# Patient Record
Sex: Female | Born: 1950
Health system: Southern US, Community
[De-identification: ages and names within clinical notes are randomized; demographics above are authoritative.]

## PROBLEM LIST (undated history)

## (undated) DIAGNOSIS — I809 Phlebitis and thrombophlebitis of unspecified site: Secondary | ICD-10-CM

## (undated) DIAGNOSIS — T7840XA Allergy, unspecified, initial encounter: Secondary | ICD-10-CM

## (undated) DIAGNOSIS — I10 Essential (primary) hypertension: Secondary | ICD-10-CM

## (undated) DIAGNOSIS — M069 Rheumatoid arthritis, unspecified: Secondary | ICD-10-CM

## (undated) HISTORY — DX: Allergy, unspecified, initial encounter: T78.40XA

## (undated) HISTORY — PX: LASER ABLATION: SHX1947

## (undated) HISTORY — DX: Essential (primary) hypertension: I10

## (undated) HISTORY — DX: Phlebitis and thrombophlebitis of unspecified site: I80.9

---

## 1992-06-25 HISTORY — PX: KNEE ARTHROSCOPY: SHX127

## 1993-06-25 HISTORY — PX: ABDOMINAL HYSTERECTOMY: SHX81

## 1998-05-09 ENCOUNTER — Encounter (HOSPITAL_COMMUNITY): Admission: RE | Admit: 1998-05-09 | Discharge: 1998-08-07 | Payer: Self-pay | Admitting: Rheumatology

## 1998-10-12 ENCOUNTER — Encounter (HOSPITAL_COMMUNITY): Admission: RE | Admit: 1998-10-12 | Discharge: 1999-01-10 | Payer: Self-pay | Admitting: Rheumatology

## 2001-06-25 HISTORY — PX: SHOULDER ARTHROSCOPY: SHX128

## 2001-06-25 HISTORY — PX: ROTATOR CUFF REPAIR: SHX139

## 2001-11-13 ENCOUNTER — Ambulatory Visit (HOSPITAL_BASED_OUTPATIENT_CLINIC_OR_DEPARTMENT_OTHER): Admission: RE | Admit: 2001-11-13 | Discharge: 2001-11-13 | Payer: Self-pay | Admitting: Orthopedic Surgery

## 2003-06-26 HISTORY — PX: SHOULDER ARTHROSCOPY W/ ROTATOR CUFF REPAIR: SHX2400

## 2003-08-09 ENCOUNTER — Encounter: Admission: RE | Admit: 2003-08-09 | Discharge: 2003-08-09 | Payer: Self-pay | Admitting: Rheumatology

## 2004-02-21 IMAGING — CR DG LUMBAR SPINE COMPLETE 4+V
5 series · 5 of 5 positions shown · non-contrast
Comparison: none

CLINICAL DATA: Back pain and right hip pain.  History of rheumatoid arthritis.
COMPLETE RIGHT HIP ? 2 VIEWS ? 08/09/03 
There is no evidence of fracture or dislocation. No other significant bone or soft tissue abnormalities are identified. The joint spaces are within normal limits.

[view not recorded (1 of 5)]
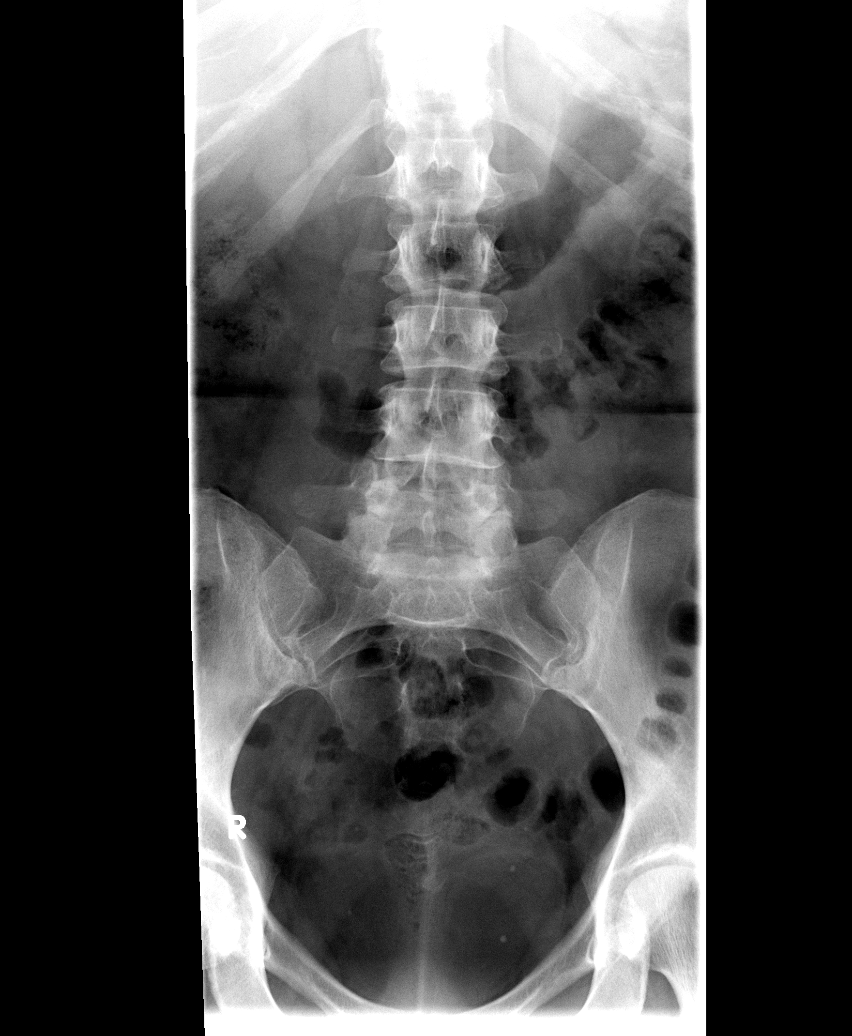

[view not recorded (2 of 5)]
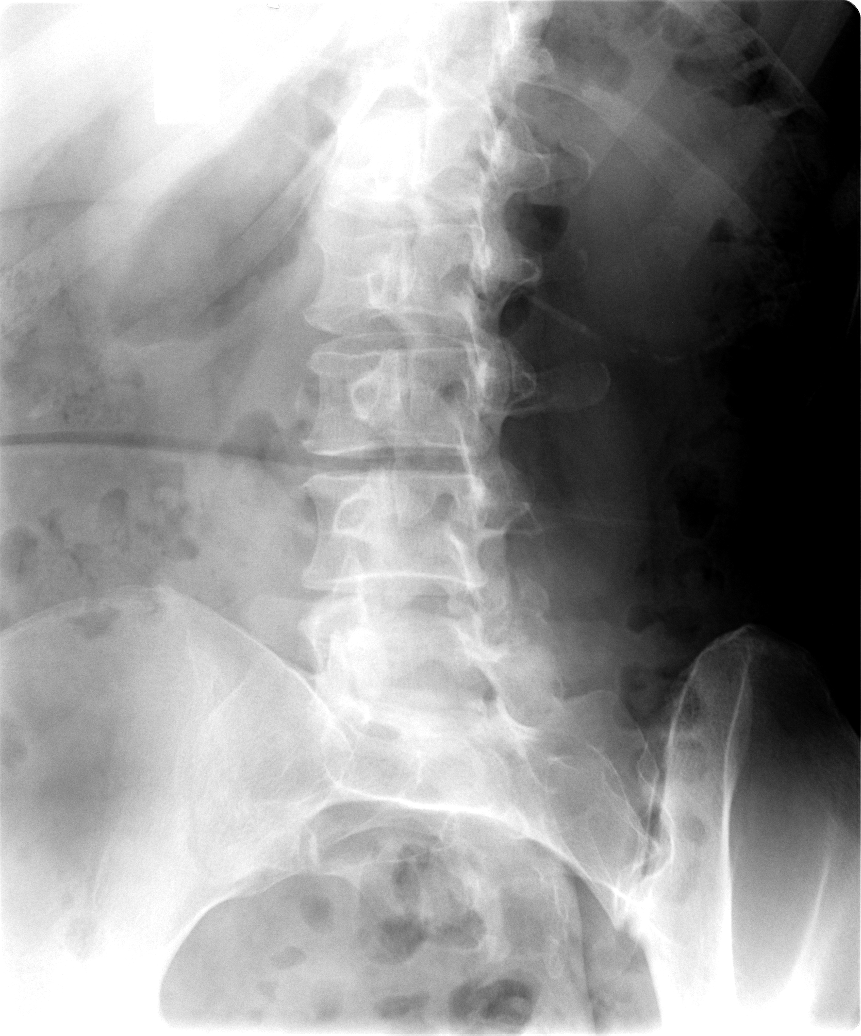

[view not recorded (3 of 5)]
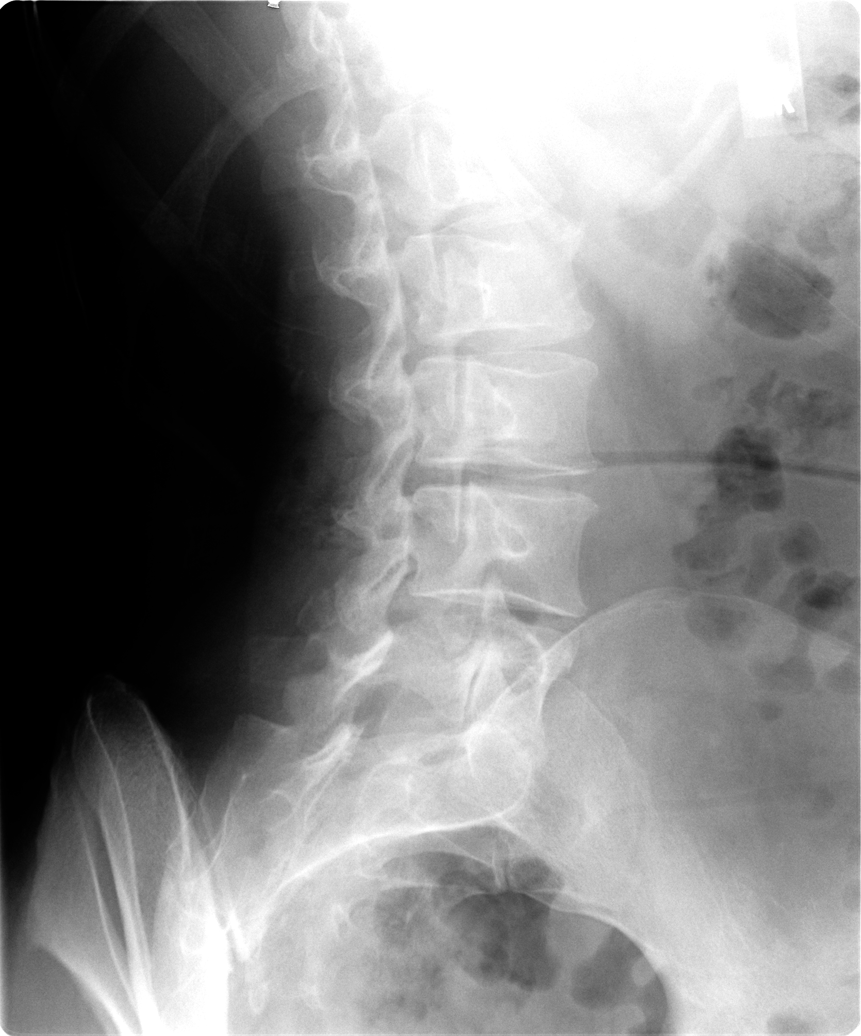

[view not recorded (4 of 5)]
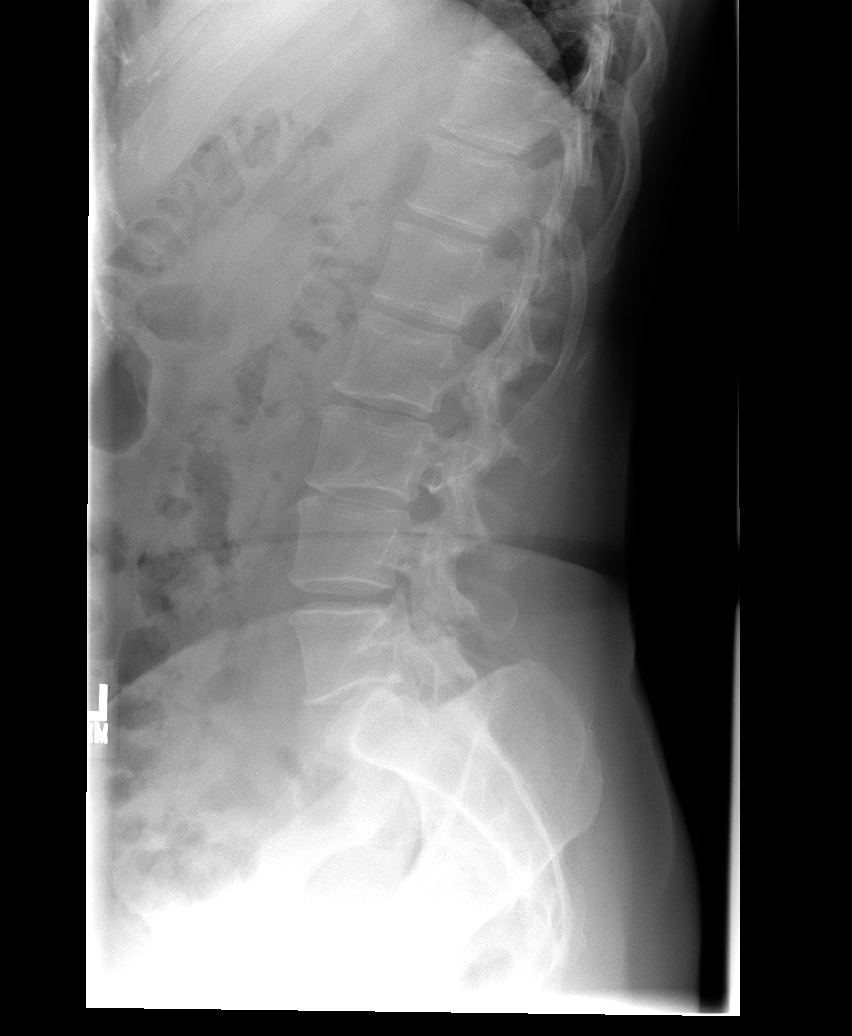

[view not recorded (5 of 5)]
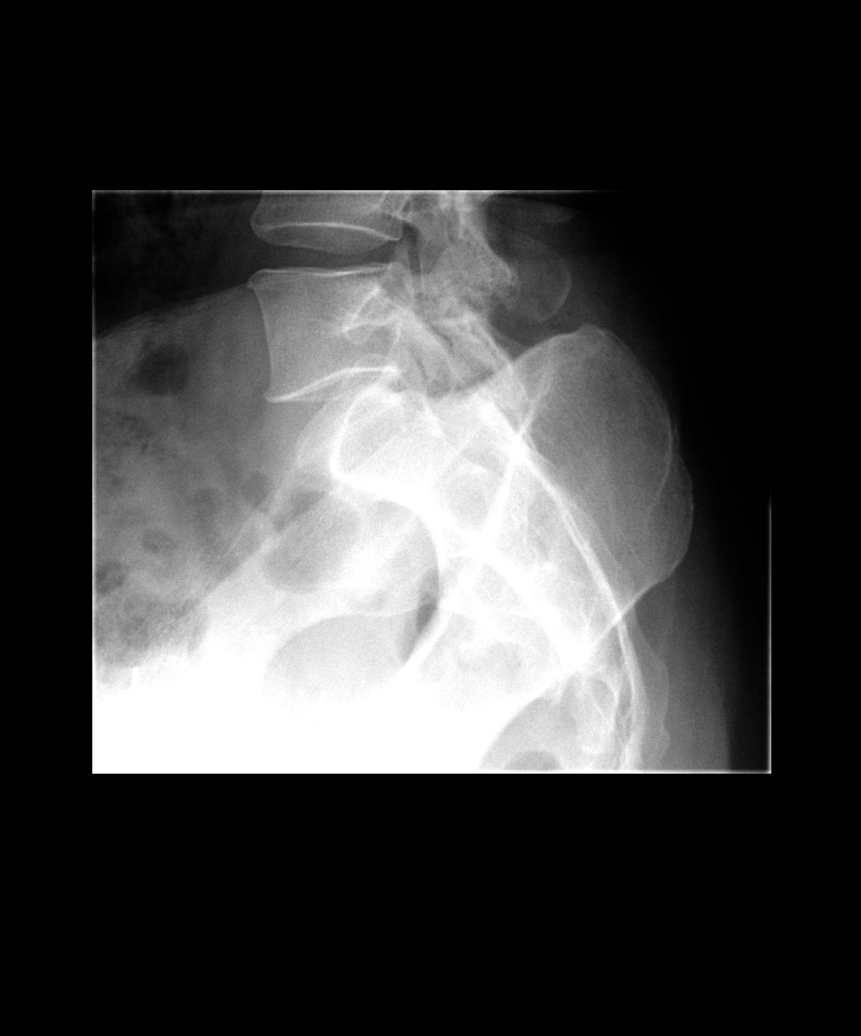

[5 of 5 positions shown; findings below may reference images not displayed]

IMPRESSION
Normal study. 
COMPLETE LUMBAR SPINE ? 08/09/03 
Mild to moderate degenerative disk disease and spondylosis throughout the lumbar spine are noted.  Normal lumbar alignment is noted. Mild facet arthropathy in the lower lumbar spine.
IMPRESSION
Mild to moderate degenerative disk disease and spondylosis through the lumbar spine.

## 2004-03-31 ENCOUNTER — Ambulatory Visit (HOSPITAL_COMMUNITY): Admission: RE | Admit: 2004-03-31 | Discharge: 2004-03-31 | Payer: Self-pay | Admitting: Orthopedic Surgery

## 2004-05-03 ENCOUNTER — Ambulatory Visit (HOSPITAL_BASED_OUTPATIENT_CLINIC_OR_DEPARTMENT_OTHER): Admission: RE | Admit: 2004-05-03 | Discharge: 2004-05-03 | Payer: Self-pay | Admitting: Orthopedic Surgery

## 2004-05-03 ENCOUNTER — Ambulatory Visit (HOSPITAL_COMMUNITY): Admission: RE | Admit: 2004-05-03 | Discharge: 2004-05-03 | Payer: Self-pay | Admitting: Orthopedic Surgery

## 2007-12-31 ENCOUNTER — Ambulatory Visit: Payer: Self-pay | Admitting: Vascular Surgery

## 2008-02-02 ENCOUNTER — Ambulatory Visit: Payer: Self-pay | Admitting: Vascular Surgery

## 2008-02-06 ENCOUNTER — Ambulatory Visit: Payer: Self-pay | Admitting: Vascular Surgery

## 2008-03-03 ENCOUNTER — Ambulatory Visit: Payer: Self-pay | Admitting: Vascular Surgery

## 2008-03-10 ENCOUNTER — Ambulatory Visit: Payer: Self-pay | Admitting: Vascular Surgery

## 2008-04-14 ENCOUNTER — Ambulatory Visit: Payer: Self-pay | Admitting: Vascular Surgery

## 2009-02-02 ENCOUNTER — Encounter: Admission: RE | Admit: 2009-02-02 | Discharge: 2009-02-02 | Payer: Self-pay | Admitting: Rheumatology

## 2010-11-07 NOTE — Assessment & Plan Note (Signed)
OFFICE VISIT   Samantha [name], Samantha [name]  DOB:  1951-05-28                                       02/02/2008  AVWUJ#:[id number]   The patient is a patient who has been evaluated by Dr. Arbie Cookey for severe  venous insufficiency in the right leg with painful varicosities and  distal edema.  She was wearing elastic compression stockings and trying  her analgesics (such as Aleve and Tylenol) on a regular basis, as well  as elevation of the legs with no improvement of her symptoms.  Two days  ago, she developed a spontaneous bleed from a varix in the subcutaneous  position with increasing discomfort, and on exam today, she has a large  ecchymotic area surrounding these bulging varicosities in the right  medial calf.  She has no evidence of acute thrombophlebitis, but she  clearly has bleeding from varix beneath the skin.  She has also not had  any improvement in her symptoms after a month of wearing elastic  compression stockings.  I think it would be wise for Dr. Arbie Cookey to  proceed with laser ablation to the right greater saphenous vein with  multiple stab phlebectomies in the near future, and we will proceed with  precertification.   Quita Skye Hart Rochester, M.D.  Electronically Signed   JDL/MEDQ  D:  02/02/2008  T:  02/03/2008  Job:  [id number]

## 2010-11-07 NOTE — Assessment & Plan Note (Signed)
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  She has resolved all of  the bruising from the time of her procedure and is quite pleased with  her result.  She had an area over her posterior thigh that made it very  uncomfortable for her to sit, and this has resolved.  Her wounds are all  healing quite nicely.  She is pleased with her result, as am I, and I  plan to see her again on an as needed basis.   Larina Earthly, M.D.  Electronically Signed   TFE/MEDQ  D:  04/14/2008  T:  04/15/2008  Job:  [id number]

## 2010-11-07 NOTE — Consult Note (Signed)
NEW PATIENT CONSULTATION   [name], Samantha K  DOB:  03/08/1951                                       12/31/2007  EAVWU#:[id number]   The patient presents today for evaluation of pain and swelling with  right leg venous varicosities.  She has had a long history of venous  varicosities and recently has began having more severe pain related to  varicosities over the posterior aspect of her right thigh.  She does  have a remote history of superficial thrombophlebitis in the area around  her knee.  She does not have any history of deep venous thrombosis.  She  reports that this is most pronounced when she is sitting and can happen  with standing as well.  She does report some swelling in her right foot  and ankle at the end of the day as well.  She does take Aleve and  Tylenol for this discomfort, has not worn graduated compression  stockings.  She has tried weight loss which has not been effective as  well.  She does have a history of varicose veins in her mother.   PAST MEDICAL HISTORY:  Is significant for hysterectomy in 1994, knee  arthroscopy in 1995, rotator cuff repair 2003.  Does have a history of  rheumatoid arthritis and is on methotrexate for this.  She is also on  Humira, multivitamins and folic acid.  She does not smoke cigarettes.   PHYSICAL EXAM:  General:  Well-developed, well-nourished white female  appearing stated age of 73.  Vital signs:  Blood pressure is 120/78,  heart rate 76 and respirations 16.  Extremities:  Her left leg is  without varicosities.  Right leg, she does have pronounced saphenous  tributary varicosities over her posterior distal thigh and also over her  medial calf.   On screening duplex she does have gross reflux in her right great  saphenous vein feeding into these varicosities.  I discussed the  significance of her venous hypertension and venous reflux with the  patient.  I recommended we initially begin treatment with  graduated  compression stockings.  She is fitted today with 20-30 mm stockings and  is instructed on their use.  I will see her again in 3 months for a  continued discussion to determine if this conservative treatment is  aiding her symptoms.  I did discuss the potential possibility for laser  ablation and stab phlebectomy if she fails conservative treatment.  We  will see her again in 3 months for continued discussion.   Larina Earthly, M.D.  Electronically Signed   TFE/MEDQ  D:  12/31/2007  T:  01/01/2008  Job:  [id number]

## 2010-11-07 NOTE — Procedures (Signed)
LOWER EXTREMITY VENOUS REFLUX EXAM   INDICATION:  Chronic right leg varicose vein with recent bleed at the  proximal right calf.   EXAM:  Using color-flow imaging and pulse Doppler spectral analysis, the  right common femoral, superficial femoral, popliteal, posterior tibial,  greater and lesser saphenous veins are evaluated.  There is evidence  suggesting deep venous insufficiency in the right common femoral vein  only.   The right saphenofemoral junction is not competent.  The right GSV is  not competent with the caliber as described below.   The right proximal short saphenous vein demonstrates competency.   GSV Diameter (used if found to be incompetent only)                                            Right    Left  Proximal Greater Saphenous Vein           0.82 cm  cm  Proximal-to-mid-thigh                     0.60 cm  cm  Mid thigh                                 0.68 cm  cm  Mid-distal thigh                          0.54 cm  cm  Distal thigh                              0.32 cm  cm  Knee                                      0.60 cm  cm    IMPRESSION:  1. Right greater saphenous vein reflux is identified with the caliber      ranging from 0.32 cm to 0.82 cm knee to groin.  2. The right greater saphenous vein is not aneurysmal.  3. The right greater saphenous vein is not tortuous.  4. The deep venous system is competent except for the right common      femoral vein.  5. The right lesser saphenous vein is competent.        ___________________________________________  Larina Earthly, M.D.   MC/MEDQ  D:  02/06/2008  T:  02/06/2008  Job:  [id number]

## 2010-11-07 NOTE — Procedures (Signed)
DUPLEX DEEP VENOUS EXAM - LOWER EXTREMITY   INDICATION:  Follow-up evaluation, status post laser ablation.   HISTORY:  Edema:  No.  Trauma/Surgery:  On 03/03/08, patient had right greater saphenous vein  laser ablation.  Pain:  Tenderness at the mid thigh.  PE:  No.  Previous DVT:  Superficial venous thrombus in 1982 in the right leg,  post partum.  Anticoagulants:  No.  Other:   DUPLEX EXAM:                CFV   SFV   PopV  PTV    GSV                R  L  R  L  R  L  R   L  R  L  Thrombosis    o     o     o     o      +  Spontaneous   +     +     +     +      0  Phasic        +     +     +     +      0  Augmentation  +     +     +     +      0  Compressible  +     +     +     +      0  Competent     +     +     +     +      0   Legend:  + - yes  o - no  p - partial  D - decreased   IMPRESSION:  1. The right greater saphenous vein is thrombosed up to the      saphenofemoral junction.  There is no thrombus present in the      common femoral vein.  2. The right common femoral vein is competent.  3. No evidence of deep venous thrombosis or baker's cyst in the right      leg.   _____________________________  Larina Earthly, M.D.   MC/MEDQ  D:  03/10/2008  T:  03/10/2008  Job:  [id number]

## 2010-11-07 NOTE — Assessment & Plan Note (Signed)
OFFICE VISIT   [name], Samantha K  DOB:  23-Jun-1951                                       02/06/2008  [id number]#:[id number]   Here today for continued followup of her right leg venous pathology.  She saw Dr. Hart [place] on 08/10 where she had a significant subcutaneous  bleed from the varicosities in her medial proximal calf.  She underwent  formal duplex today and this reveals reflux throughout her saphenous  vein on the right.  She has no evidence of DVT and has mild reflux in  the common femoral vein associated with her saphenous reflux.  She does  have the area of her posterior distal thigh where there is a reticular  vein that is a tributary off of her saphenous vein.  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  This is in communication with her saphenous vein.  I suspect that she will have improvement with ablation of her saphenous  vein.  She is to continue her compression garments in the interim.  I  feel with her recent subcutaneous bleed despite compression garments  that we should proceed with laser ablation of the right greater  saphenous vein, stab phlebectomy of the tributaries on the right leg, as  well as sclerotherapy for symptom relief and to prevent ongoing  complications. We will proceed with precertification to assure insurance  coverage.   Larina Earthly, M.D.  Electronically Signed   TFE/MEDQ  D:  02/06/2008  T:  02/09/2008  Job:  [id number]

## 2010-11-07 NOTE — Assessment & Plan Note (Signed)
OFFICE VISIT   [name], Samantha K  DOB:  1950/07/04                                       03/10/2008  [id number]#:[id number]   The patient presents today for 1-week followup of her right greater  saphenous vein laser ablation and stab phlebectomy of multiple tributary  varicosities in her thigh, calf, and ankle.  She has done quite well  since her procedure.  She had the usual amount of discomfort and  bruising.  Her phlebectomy sites look actually quite good, especially  over her ankle.  She has marked varicosities which are completely  resolved.  She underwent duplex today, and this reveals occluded great  saphenous vein on the right from her knee to the saphenofemoral  junction.  She has no injury to the deep venous system on the right.  I  am quite pleased with her initial result, as is the patient.  I plan to  see her again in 6 weeks for followup.   Larina Earthly, M.D.  Electronically Signed   TFE/MEDQ  D:  03/10/2008  T:  03/11/2008  Job:  [id number]

## 2010-11-10 NOTE — Op Note (Signed)
[facility]  Patient:    Samantha [name], Samantha [name] Visit Number: [id number] MRN: [id number]          Service Type: DSU Location: [facility] Attending Physician:  Colbert Ewing Dictated by:   Loreta Ave, M.D. Proc. 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  No full thickness tears.  Anterior labral tear.  PROCEDURE:  Left shoulder exam under anesthesia, arthroscopy, debridement of labrum of the rotator cuff, arthroscopy with acromioplasty, release of CA ligament, excision of distal clavicle.  SURGEON:  Loreta Ave, M.D.  ASSISTANT:  Arlys John D. Petrarca, P.A.-C.  ANESTHESIA:  General.  ESTIMATED BLOOD LOSS:  Minimal.  SPECIMENS:  None.  CULTURES:  None.  COMPLICATIONS:  None.  DRESSING:  Self-compressive with sling.  DESCRIPTION OF PROCEDURE:  The patient was brought to the operating room and placed on the operating table in the supine position.  After adequate anesthesia had been obtained, the left shoulder was examined.  Full motion and good stability.  Placed in a beach chair position on the shoulder positioner. Prepped and draped in the usual sterile fashion.  Three standard arthroscopic portals - anterior, posterior, and lateral.  Shoulder entered with a blunt obturator, distended and inspected.  Articular cartilage intact throughout. Some mild fraying of the anterior labrum at the 2 oclock position.  Debrided with the shaver to a stable surface.  Biceps tendon and biceps anchor intact. Rotator cuff from below looked good as did the biceps at the hiatus. Remaining  structures in the shoulder were excellent in regards to articular cartilage and capsular ligamentous structures.  Cannula redirected subacromially.  Reactive bursitis.  Abrasive tearing in the superior cuff. Type 2 acromion.  Bursa resected and cuff debrided.  Acromioplasty to a type 1 acromion utilizing shaver and high speed bur.  CA ligament incised with cautery.  Distal clavicle already had a disruptive inferior capsule.  Grade 3 and 4 changes.  Lateral centimeter of the clavicle and periarticular spur is resected, resecting a centimeter of the clavicle.  Adequacy of decompression and clavicle excision confirmed viewing from all portals.  Instruments and fluid removed.  Portals closed and the bursa injected with Marcaine.  Portals closed with 4-0 nylon.  A sterile compressive dressing applied with sling.  Anesthesia reversed.  Brought to the recovery room.  Tolerated surgery well with no complications. Dictated by:   Loreta Ave, M.D. Attending Physician:  Colbert Ewing DD:  11/13/01 TD:  11/15/01 Job: [id number]

## 2010-11-10 NOTE — Op Note (Signed)
NAMEELLANA, KAWA              ACCOUNT NO.:  [id number]   MEDICAL RECORD NO.:  [id number]          PATIENT TYPE:  AMB   LOCATION:  DSC                          FACILITY:  [facility]   PHYSICIAN:  Loreta Ave, M.D. 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   POSTOPERATIVE DIAGNOSES:  Right shoulder subacromial impingement with distal  clavicle osteolysis.  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  Focal grade 3 and 4 changes, humeral head as well as complex  circumferential labral tears.   OPERATION PERFORMED:  Right shoulder examination under anesthesia,  arthroscopy, debridement glenohumeral joint including chondroplasty of the  humeral head.  Debridement of labral tear.  Debridement of rotator cuff  above and below.  Bursectomy.  Acromioplasty.  Release coracoacromial  ligament.  Excision of distal clavicle.   SURGEON:  Loreta Ave, M.D.   ASSISTANT:  Genene Churn. Denton Meek.   ANESTHESIA:  General.   ESTIMATED BLOOD LOSS:  Minimal.   SPECIMENS:  None.   CULTURES:  None.   COMPLICATIONS:  None.   DRESSING:  Soft compressive with sling.   DESCRIPTION OF PROCEDURE:  The patient was brought to the operating room and  after adequate anesthesia had been obtained, placed in a beach chair  position and prepped and draped in the usual sterile fashion.  Full motion,  stable shoulder.  Three standard arthroscopic portals, anterior, posterior  lateral.  Shoulder entered with blunt obturator, distended and inspected.  Most of the glenohumeral joint looked good  except for a focal little bit  more than 1 cm diameter area of full thickness loss from rheumatoid  arthritis.  Chondroplasty throughout the area.  Down to bone into little  subchondral cyst, all debrided.  The remaining articular cartilage looked  quite good and there was not a lot of inflammatory arthropathy in the  shoulder.  Complex tearing of the labrum circumferential mostly at the top  half debrided.  Biceps tendon, biceps anchor intact.  Undersurface of the  cuff looked good.  Cannula redirected subacromially.  Typical reactive  bursitis and impingement.  Cuff debrided.  No full thickness tears.  Bursa  resected.  Acromioplasty from a type 2 to a type 1 acromion releasing the  coracoacromial ligament.  Distal clavicle grade 4 changes, marked bony  destruction and osteolysis.  Periarticular spurs.  Periarticular spurs  removed.  Joint debrided.  Lateral centimeter of clavicle resected.  Adequacy of decompression and clavicle excision confirmed viewing from all  portals.  Instruments and fluid removed.  Portals of shoulder and bursa  injected with Marcaine.  Portals closed with 4-0 nylon.  Sterile compressive  dressing with sling applied.  Anesthesia reversed.  Brought to recovery  room.  Tolerated surgery well without complication.  Valentino Saxon   DFM/MEDQ  D:  05/03/2004  T:  05/03/2004  Job:  [id number]

## 2011-11-12 ENCOUNTER — Encounter: Payer: Self-pay | Admitting: Emergency Medicine

## 2011-11-12 ENCOUNTER — Emergency Department
Admission: EM | Admit: 2011-11-12 | Discharge: 2011-11-12 | Disposition: A | Discharge status: Home / Self Care | Payer: 59 | Source: Home / Self Care | Attending: Family Medicine | Admitting: Family Medicine

## 2011-11-12 DIAGNOSIS — H9390 Unspecified disorder of ear, unspecified ear: Secondary | ICD-10-CM

## 2011-11-12 DIAGNOSIS — J069 Acute upper respiratory infection, unspecified: Secondary | ICD-10-CM

## 2011-11-12 HISTORY — DX: Rheumatoid arthritis, unspecified: M06.9

## 2011-11-12 MED ORDER — CEFDINIR 300 MG PO CAPS: 300.0000 mg | ORAL_CAPSULE | Freq: Two times a day (BID) | ORAL | Status: AC

## 2011-11-12 NOTE — ED Provider Notes (Signed)
History     CSN: [id number]  Arrival date & time 11/12/11  1724   First MD Initiated Contact with Patient 11/12/11 1759      Chief Complaint  Patient presents with  . URI      HPI Comments: Patient complains of approximately 7 day history of gradually progressive URI symptoms beginning with a mild sore throat (now improved), followed by progressive nasal congestion.  She has had a minimal cough for about 4 days.  Yesterday she developed increased left facial pressure.  Her ears have felt somewhat clogged for about 4 days, worse on the left. Complains of fatigue but no myalgias. There has been no pleuritic pain, shortness of breath, or wheezes.   The history is provided by the patient.    Past Medical History  Diagnosis Date  . RA (rheumatoid arthritis)     Past Surgical History  Procedure Date  . Abdominal hysterectomy     History reviewed. No pertinent family history.  History  Substance Use Topics  . Smoking status: Never Smoker   . Smokeless tobacco: Not on file  . Alcohol Use: No    OB History    Grav Para Term Preterm Abortions TAB SAB Ect Mult Living                  Review of Systems + sore throat + cough No pleuritic pain No wheezing + nasal congestion + post-nasal drainage + sinus pain/pressure No itchy/red eyes ? earache No hemoptysis No SOB No fever/chills No nausea No vomiting No abdominal pain No diarrhea No urinary symptoms No skin rashes + fatigue No myalgias No headache Used OTC meds without relief (Mucinex) Allergies  Tetracyclines & related  Home Medications   Current Outpatient Rx  Name Route Sig Dispense Refill  . DM-GUAIFENESIN ER 30-600 MG PO TB12 Oral Take 1 tablet by mouth every 12 (twelve) hours.    Marland Kitchen FOLIC ACID 1 MG PO TABS Oral Take 1 mg by mouth daily.    . IBUPROFEN 200 MG PO TABS Oral Take 200 mg by mouth every 6 (six) hours as needed.    . METHOTREXATE SODIUM 15 MG PO TABS Oral Take 15 mg by mouth once a week.  Caution: Chemotherapy. Protect from light.    Marland Kitchen NAPROXEN SODIUM 220 MG PO TABS Oral Take 220 mg by mouth 2 (two) times daily with a meal.    . CEFDINIR 300 MG PO CAPS Oral Take 1 capsule (300 mg total) by mouth 2 (two) times daily. 14 capsule 0    BP 142/92  Pulse 71  Temp(Src) 98.7 F (37.1 C) (Oral)  Resp 16  Ht 5\' 3"  (1.6 m)  Wt 177 lb (80.287 kg)  BMI 31.35 kg/m2  SpO2 98%  Physical Exam Nursing notes and Vital Signs reviewed. Appearance:  Patient appears stated age, and in no acute distress.  Patient is obese (BMI 31.4) Eyes:  Pupils are equal, round, and reactive to light and accomodation.  Extraocular movement is intact.  Conjunctivae are not inflamed  Ears:  Canals normal.   Right tympanic membrane normal;  Left tympanic membrane suspicious for clear effusion but not definite  Nose:  Mildly congested turbinates.  No sinus tenderness.     Pharynx:  Normal Neck:  Supple.  Slightly tender shotty posterior nodes are palpated bilaterally  Lungs:  Clear to auscultation.  Breath sounds are equal.  Heart:  Regular rate and rhythm without murmurs, rubs, or gallops.  Abdomen:  Nontender  without masses or hepatosplenomegaly.  Bowel sounds are present.  No CVA or flank tenderness.  Extremities:  No edema.  No calf tenderness Skin:  No rash present.   ED Course  Procedures none  Labs Reviewed - Tympanogram normal bilaterally    1. Acute upper respiratory infections of unspecified site       MDM  There is no evidence of bacterial infection today.   Treat symptomatically for now: Take plain Mucinex (guaifenesin) twice daily for cough and congestion.  May add Sudafed as needed.  Increase fluid intake, rest. May use Afrin nasal spray (or generic oxymetazoline) twice daily for about 5 days.  Also recommend using saline nasal spray several times daily and saline nasal irrigation (AYR is a common brand) May take Mucinex DM at bedtime for cough. Stop all antihistamines for now, and  other non-prescription cough/cold preparations. Begin Omnicef if not improving about 5 days or if persistent fever develops (Given a prescription to hold, with an expiration date)  Follow-up with family doctor if not improving 7 to 10 days.         Lattie Haw, MD 11/13/11 1041

## 2011-11-12 NOTE — Discharge Instructions (Signed)
Take plain Mucinex (guaifenesin) twice daily for cough and congestion.  May add Sudafed as needed.  Increase fluid intake, rest. May use Afrin nasal spray (or generic oxymetazoline) twice daily for about 5 days.  Also recommend using saline nasal spray several times daily and saline nasal irrigation (AYR is a common brand) May take Mucinex DM at bedtime for cough. Stop all antihistamines for now, and other non-prescription cough/cold preparations. Begin Omnicef if not improving about 5 days or if persistent fever develops. Follow-up with family doctor if not improving 7 to 10 days.

## 2011-11-12 NOTE — ED Notes (Signed)
Congestion, Drainage, gray-green mucus, ears hurt x 1 week

## 2013-04-17 ENCOUNTER — Telehealth: Payer: Self-pay

## 2013-04-17 NOTE — Telephone Encounter (Addendum)
Left message for call back Non identifiable Unable to reach prior to visit

## 2013-04-17 NOTE — Telephone Encounter (Signed)
Duplicate/error

## 2013-04-21 ENCOUNTER — Encounter: Payer: Self-pay | Admitting: Family Medicine

## 2013-04-21 ENCOUNTER — Ambulatory Visit (INDEPENDENT_AMBULATORY_CARE_PROVIDER_SITE_OTHER): Payer: 59 | Admitting: Family Medicine

## 2013-04-21 ENCOUNTER — Other Ambulatory Visit: Payer: Self-pay | Admitting: *Deleted

## 2013-04-21 VITALS — BP 124/82 | HR 77 | Temp 98.7°F | Resp 16 | Ht 61.25 in | Wt 187.4 lb

## 2013-04-21 DIAGNOSIS — Z1211 Encounter for screening for malignant neoplasm of colon: Secondary | ICD-10-CM

## 2013-04-21 DIAGNOSIS — M069 Rheumatoid arthritis, unspecified: Secondary | ICD-10-CM

## 2013-04-21 DIAGNOSIS — Z0000 Encounter for general adult medical examination without abnormal findings: Secondary | ICD-10-CM

## 2013-04-21 DIAGNOSIS — Z1231 Encounter for screening mammogram for malignant neoplasm of breast: Secondary | ICD-10-CM

## 2013-04-21 LAB — CBC WITH DIFFERENTIAL/PLATELET
Basophils Absolute: 0 10*3/uL (ref 0.0–0.1)
Basophils Relative: 0.6 % (ref 0.0–3.0)
Eosinophils Absolute: 0.1 10*3/uL (ref 0.0–0.7)
Eosinophils Relative: 1.7 % (ref 0.0–5.0)
HCT: 38.3 % (ref 36.0–46.0)
Hemoglobin: 13.2 g/dL (ref 12.0–15.0)
Lymphocytes Relative: 43.2 % (ref 12.0–46.0)
Lymphs Abs: 3 10*3/uL (ref 0.7–4.0)
MCHC: 34.5 g/dL (ref 30.0–36.0)
MCV: 93.1 fl (ref 78.0–100.0)
Monocytes Absolute: 0.5 10*3/uL (ref 0.1–1.0)
Monocytes Relative: 7.7 % (ref 3.0–12.0)
Neutro Abs: 3.3 10*3/uL (ref 1.4–7.7)
Neutrophils Relative %: 46.8 % (ref 43.0–77.0)
Platelets: 313 10*3/uL (ref 150.0–400.0)
RBC: 4.11 Mil/uL (ref 3.87–5.11)
RDW: 12.3 % (ref 11.5–14.6)
WBC: 7 10*3/uL (ref 4.5–10.5)

## 2013-04-21 LAB — BASIC METABOLIC PANEL
BUN: 22 mg/dL (ref 6–23)
CO2: 27 mEq/L (ref 19–32)
Calcium: 9 mg/dL (ref 8.4–10.5)
Chloride: 106 mEq/L (ref 96–112)
Creatinine, Ser: 0.8 mg/dL (ref 0.4–1.2)
GFR: 81.06 mL/min (ref >60.00)
Glucose, Bld: 81 mg/dL (ref 70–99)
Potassium: 3.7 mEq/L (ref 3.5–5.1)
Sodium: 141 mEq/L (ref 135–145)

## 2013-04-21 LAB — HEPATIC FUNCTION PANEL
ALT: 17 U/L (ref 0–35)
AST: 18 U/L (ref 0–37)
Albumin: 4.1 g/dL (ref 3.5–5.2)
Alkaline Phosphatase: 94 U/L (ref 39–117)
Bilirubin, Direct: 0.1 mg/dL (ref 0.0–0.3)
Total Bilirubin: 0.5 mg/dL (ref 0.3–1.2)
Total Protein: 7.4 g/dL (ref 6.0–8.3)

## 2013-04-21 LAB — TSH: TSH: 0.61 u[IU]/mL (ref 0.35–5.50)

## 2013-04-21 NOTE — Assessment & Plan Note (Signed)
New to provider, chronic problem for pt.  Following w/ Rheum regularly.  On long term methotrexate.  Will follow along.

## 2013-04-21 NOTE — Patient Instructions (Signed)
Follow up in 6 months to recheck BP We'll notify you of your lab results and make any changes if needed Keep up the good work!  You look great! We'll call you with your mammo and GI appts Call with any questions or concerns Welcome!  We're glad to have you!

## 2013-04-21 NOTE — Progress Notes (Signed)
  Subjective:    Patient ID: Samantha [name], female    DOB: Aug 03, 1950, 62 y.o.   MRN: [id number]  HPI New to establish.  No previous PCP.  [facility]- Dr Kellie Simmering.  Overdue on mammo, colonoscopy.  No need for pap due to TAH.  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  Did take the flu shot the 1st year [facility] required it but this brought RA out of remission and took extended period of time and multiple rounds of steroids to control sxs.   Review of Systems Patient reports no vision/ hearing changes, adenopathy,fever, weight change,  persistant/recurrent hoarseness , swallowing issues, chest pain, palpitations, edema, persistant/recurrent cough, hemoptysis, dyspnea (rest/exertional/paroxysmal nocturnal), gastrointestinal bleeding (melena, rectal bleeding), abdominal pain, significant heartburn, bowel changes, GU symptoms (dysuria, hematuria, incontinence), Gyn symptoms (abnormal  bleeding, pain),  syncope, focal weakness, memory loss, numbness & tingling, skin/hair/nail changes, abnormal bruising or bleeding, anxiety, or depression.     Objective:   Physical Exam General Appearance:    Alert, cooperative, no distress, appears stated age  Head:    Normocephalic, without obvious abnormality, atraumatic  Eyes:    PERRL, conjunctiva/corneas clear, EOM's intact, fundi    benign, both eyes  Ears:    Normal TM's and external ear canals, both ears  Nose:   Nares normal, septum midline, mucosa normal, no drainage    or sinus tenderness  Throat:   Lips, mucosa, and tongue normal; teeth and gums normal  Neck:   Supple, symmetrical, trachea midline, no adenopathy;    Thyroid: no enlargement/tenderness/nodules  Back:     Symmetric, no curvature, ROM normal, no CVA tenderness  Lungs:     Clear to auscultation bilaterally, respirations unlabored  Chest Wall:    No tenderness or deformity   Heart:    Regular rate and rhythm, S1 and S2 normal, no murmur, rub   or gallop  Breast Exam:     Deferred to mammo  Abdomen:     Soft, non-tender, bowel sounds active all four quadrants,    no masses, no organomegaly  Genitalia:    Deferred  Rectal:    Extremities:   Extremities normal, atraumatic, no cyanosis or edema  Pulses:   2+ and symmetric all extremities  Skin:   Skin color, texture, turgor normal, no rashes or lesions  Lymph nodes:   Cervical, supraclavicular, and axillary nodes normal  Neurologic:   CNII-XII intact, normal strength, sensation and reflexes    throughout          Assessment & Plan:

## 2013-04-21 NOTE — Assessment & Plan Note (Signed)
Pt's PE WNL w/ exception of obesity.  Overdue on health maintenance- will refer for mammo, colonoscopy.  No need for paps.  Check labs.  Anticipatory guidance provided.

## 2013-04-23 ENCOUNTER — Other Ambulatory Visit (INDEPENDENT_AMBULATORY_CARE_PROVIDER_SITE_OTHER): Payer: 59

## 2013-04-23 DIAGNOSIS — Z0000 Encounter for general adult medical examination without abnormal findings: Secondary | ICD-10-CM

## 2013-04-23 LAB — LIPID PANEL
Cholesterol: 202 mg/dL — ABNORMAL HIGH (ref 0–200)
HDL: 45.1 mg/dL (ref >39.00)
Total CHOL/HDL Ratio: 4
Triglycerides: 110 mg/dL (ref 0.0–149.0)
VLDL: 22 mg/dL (ref 0.0–40.0)

## 2013-04-23 LAB — LDL CHOLESTEROL, DIRECT: Direct LDL: 144.2 mg/dL

## 2013-04-27 LAB — VITAMIN D 1,25 DIHYDROXY
Vitamin D 1, 25 (OH)2 Total: 78 pg/mL — ABNORMAL HIGH (ref 18–72)
Vitamin D2 1, 25 (OH)2: <8 pg/mL
Vitamin D3 1, 25 (OH)2: 78 pg/mL

## 2013-05-27 ENCOUNTER — Encounter: Payer: Self-pay | Admitting: Family Medicine

## 2013-07-30 ENCOUNTER — Other Ambulatory Visit: Payer: Self-pay | Admitting: Rheumatology

## 2013-07-30 ENCOUNTER — Ambulatory Visit
Admission: RE | Admit: 2013-07-30 | Discharge: 2013-07-30 | Disposition: A | Discharge status: Home / Self Care | Payer: 59 | Source: Ambulatory Visit | Attending: Rheumatology | Admitting: Rheumatology

## 2013-07-30 DIAGNOSIS — M545 Low back pain, unspecified: Secondary | ICD-10-CM

## 2013-10-19 ENCOUNTER — Ambulatory Visit: Payer: 59 | Admitting: Family Medicine

## 2015-08-04 DIAGNOSIS — M0570 Rheumatoid arthritis with rheumatoid factor of unspecified site without organ or systems involvement: Secondary | ICD-10-CM | POA: Diagnosis not present

## 2015-08-04 DIAGNOSIS — Z79899 Other long term (current) drug therapy: Secondary | ICD-10-CM | POA: Diagnosis not present

## 2015-08-29 DIAGNOSIS — M79641 Pain in right hand: Secondary | ICD-10-CM | POA: Diagnosis not present

## 2015-08-29 DIAGNOSIS — M0570 Rheumatoid arthritis with rheumatoid factor of unspecified site without organ or systems involvement: Secondary | ICD-10-CM | POA: Diagnosis not present

## 2015-08-29 DIAGNOSIS — Z79899 Other long term (current) drug therapy: Secondary | ICD-10-CM | POA: Diagnosis not present

## 2015-08-29 MED FILL — METHYLPREDNISOLONE 4 MG TAB: 4 | 30 days supply | Qty: 60 | Fill #0

## 2015-08-29 MED FILL — METHOTREXATE 2.5 MG TABLET: 2.5 | 84 days supply | Qty: 72 | Fill #0

## 2015-12-05 DIAGNOSIS — R739 Hyperglycemia, unspecified: Secondary | ICD-10-CM | POA: Diagnosis not present

## 2015-12-05 DIAGNOSIS — M0570 Rheumatoid arthritis with rheumatoid factor of unspecified site without organ or systems involvement: Secondary | ICD-10-CM | POA: Diagnosis not present

## 2015-12-05 DIAGNOSIS — Z79899 Other long term (current) drug therapy: Secondary | ICD-10-CM | POA: Diagnosis not present

## 2016-01-30 DIAGNOSIS — M79641 Pain in right hand: Secondary | ICD-10-CM | POA: Diagnosis not present

## 2016-01-30 DIAGNOSIS — R739 Hyperglycemia, unspecified: Secondary | ICD-10-CM | POA: Diagnosis not present

## 2016-01-30 DIAGNOSIS — M79642 Pain in left hand: Secondary | ICD-10-CM | POA: Diagnosis not present

## 2016-01-30 DIAGNOSIS — Z79899 Other long term (current) drug therapy: Secondary | ICD-10-CM | POA: Diagnosis not present

## 2016-01-30 DIAGNOSIS — M0570 Rheumatoid arthritis with rheumatoid factor of unspecified site without organ or systems involvement: Secondary | ICD-10-CM | POA: Diagnosis not present

## 2016-01-30 MED FILL — METHOTREXATE 2.5 MG TABLET: 2.5 | 84 days supply | Qty: 96 | Fill #0

## 2016-04-09 DIAGNOSIS — Z79899 Other long term (current) drug therapy: Secondary | ICD-10-CM | POA: Diagnosis not present

## 2016-04-09 DIAGNOSIS — M79642 Pain in left hand: Secondary | ICD-10-CM | POA: Diagnosis not present

## 2016-04-09 DIAGNOSIS — M79641 Pain in right hand: Secondary | ICD-10-CM | POA: Diagnosis not present

## 2016-04-09 DIAGNOSIS — M0570 Rheumatoid arthritis with rheumatoid factor of unspecified site without organ or systems involvement: Secondary | ICD-10-CM | POA: Diagnosis not present

## 2016-04-09 DIAGNOSIS — R799 Abnormal finding of blood chemistry, unspecified: Secondary | ICD-10-CM | POA: Diagnosis not present

## 2016-05-01 DIAGNOSIS — Z79899 Other long term (current) drug therapy: Secondary | ICD-10-CM | POA: Diagnosis not present

## 2016-05-01 DIAGNOSIS — M79641 Pain in right hand: Secondary | ICD-10-CM | POA: Diagnosis not present

## 2016-05-01 DIAGNOSIS — M0570 Rheumatoid arthritis with rheumatoid factor of unspecified site without organ or systems involvement: Secondary | ICD-10-CM | POA: Diagnosis not present

## 2016-05-01 DIAGNOSIS — M79642 Pain in left hand: Secondary | ICD-10-CM | POA: Diagnosis not present

## 2016-05-01 MED FILL — METHOTREXATE 2.5 MG TABLET: 2.5 | 90 days supply | Qty: 104 | Fill #0

## 2016-07-18 DIAGNOSIS — Z79899 Other long term (current) drug therapy: Secondary | ICD-10-CM | POA: Diagnosis not present

## 2016-07-18 DIAGNOSIS — M79642 Pain in left hand: Secondary | ICD-10-CM | POA: Diagnosis not present

## 2016-07-18 DIAGNOSIS — M79641 Pain in right hand: Secondary | ICD-10-CM | POA: Diagnosis not present

## 2016-07-18 DIAGNOSIS — M0570 Rheumatoid arthritis with rheumatoid factor of unspecified site without organ or systems involvement: Secondary | ICD-10-CM | POA: Diagnosis not present

## 2016-08-29 DIAGNOSIS — Z79899 Other long term (current) drug therapy: Secondary | ICD-10-CM | POA: Diagnosis not present

## 2016-08-29 DIAGNOSIS — R799 Abnormal finding of blood chemistry, unspecified: Secondary | ICD-10-CM | POA: Diagnosis not present

## 2016-08-29 DIAGNOSIS — M0570 Rheumatoid arthritis with rheumatoid factor of unspecified site without organ or systems involvement: Secondary | ICD-10-CM | POA: Diagnosis not present

## 2016-08-29 DIAGNOSIS — M65311 Trigger thumb, right thumb: Secondary | ICD-10-CM | POA: Diagnosis not present

## 2016-10-18 DIAGNOSIS — Z79899 Other long term (current) drug therapy: Secondary | ICD-10-CM | POA: Diagnosis not present

## 2016-10-18 DIAGNOSIS — M0570 Rheumatoid arthritis with rheumatoid factor of unspecified site without organ or systems involvement: Secondary | ICD-10-CM | POA: Diagnosis not present

## 2016-12-31 DIAGNOSIS — H04123 Dry eye syndrome of bilateral lacrimal glands: Secondary | ICD-10-CM | POA: Diagnosis not present

## 2016-12-31 DIAGNOSIS — H2513 Age-related nuclear cataract, bilateral: Secondary | ICD-10-CM | POA: Diagnosis not present
